# Patient Record
Sex: Female | Born: 1950 | Race: Black or African American | Hispanic: No | Marital: Married | Smoking: Current every day smoker
Health system: Southern US, Community
[De-identification: ages and names within clinical notes are randomized; demographics above are authoritative.]

## PROBLEM LIST (undated history)

## (undated) DIAGNOSIS — I10 Essential (primary) hypertension: Secondary | ICD-10-CM

## (undated) HISTORY — PX: COLONOSCOPY: SHX174

## (undated) HISTORY — PX: FOOT SURGERY: SHX648

## (undated) HISTORY — PX: ABDOMINAL HYSTERECTOMY: SHX81

## (undated) HISTORY — PX: TUBAL LIGATION: SHX77

---

## 2000-04-12 ENCOUNTER — Ambulatory Visit (HOSPITAL_COMMUNITY): Admission: RE | Admit: 2000-04-12 | Discharge: 2000-04-12 | Payer: Self-pay | Admitting: Family Medicine

## 2000-04-12 ENCOUNTER — Encounter: Payer: Self-pay | Admitting: Family Medicine

## 2001-04-22 ENCOUNTER — Encounter: Payer: Self-pay | Admitting: Family Medicine

## 2001-04-22 ENCOUNTER — Ambulatory Visit (HOSPITAL_COMMUNITY): Admission: RE | Admit: 2001-04-22 | Discharge: 2001-04-22 | Payer: Self-pay | Admitting: Family Medicine

## 2008-07-20 ENCOUNTER — Encounter: Payer: Self-pay | Admitting: Gastroenterology

## 2008-07-27 ENCOUNTER — Ambulatory Visit (HOSPITAL_COMMUNITY): Admission: RE | Admit: 2008-07-27 | Discharge: 2008-07-27 | Payer: Self-pay | Admitting: Gastroenterology

## 2008-07-27 ENCOUNTER — Ambulatory Visit: Payer: Self-pay | Admitting: Gastroenterology

## 2010-05-20 NOTE — Op Note (Signed)
NAME:  Kendra Sanford, Kendra Sanford            ACCOUNT NO.:  [id number]   MEDICAL RECORD NO.:  [id number]          PATIENT TYPE:  AMB   LOCATION:  DAY                           FACILITY:  [facility]   PHYSICIAN:  Kassie Mends, M.D.      DATE OF BIRTH:  11/01/1950   DATE OF PROCEDURE:  07/27/2008  DATE OF DISCHARGE:                               OPERATIVE REPORT   REFERRING Marilynne Dupuis:  Cleophus Molt, PA, [facility].   PROCEDURE:  Colonoscopy   INDICATION FOR EXAM:  Kendra Sanford is a 60 year old female who presents  for average-risk colon cancer screening.   FINDINGS:  Rare sigmoid colon diverticula.  Small internal hemorrhoids.  Otherwise, no polyps, masses, inflammatory changes, or AVMs seen.   RECOMMENDATIONS:  1. She should follow a high-fiber diet.  She is given a handout on      high-fiber diet, diverticulosis, and hemorrhoids.  2. Screening colonoscopy in 10 years.   MEDICATIONS:  1. Demerol 75 mg IV.  2. Versed 4 mg IV.   PROCEDURE TECHNIQUE:  Physical exam was performed.  Informed consent was  obtained from the patient after explaining the benefits, risks, and  alternatives to the procedure.  The patient was connected to the monitor  and placed in left lateral position.  Continuous oxygen was provided by  nasal cannula and IV medicine administered through an indwelling  cannula.  After administration of sedation and rectal exam, the  patient's rectum was intubated and scope was advanced under direct  visualization to the cecum.  The scope was removed slowly by carefully  examining the color, texture, anatomy, and integrity of mucosa on the  way out.  The patient was recovered in endoscopy and discharged home in  satisfactory condition.      Kassie Mends, M.D.  Electronically Signed    SM/MEDQ  D:  07/27/2008  T:  07/27/2008  Job:  [id number]   cc:   Reynolds Bowl, MD

## 2017-08-30 ENCOUNTER — Other Ambulatory Visit (HOSPITAL_COMMUNITY): Payer: Self-pay | Admitting: Internal Medicine

## 2017-08-30 DIAGNOSIS — Z1231 Encounter for screening mammogram for malignant neoplasm of breast: Secondary | ICD-10-CM

## 2017-09-08 ENCOUNTER — Encounter (HOSPITAL_COMMUNITY): Payer: Self-pay | Admitting: Radiology

## 2017-09-08 ENCOUNTER — Ambulatory Visit (HOSPITAL_COMMUNITY)
Admission: RE | Admit: 2017-09-08 | Discharge: 2017-09-08 | Disposition: A | Discharge status: Home / Self Care | Payer: Medicare Other | Source: Ambulatory Visit | Attending: Internal Medicine | Admitting: Internal Medicine

## 2017-09-08 DIAGNOSIS — Z1231 Encounter for screening mammogram for malignant neoplasm of breast: Secondary | ICD-10-CM | POA: Diagnosis not present

## 2018-03-02 ENCOUNTER — Other Ambulatory Visit (HOSPITAL_COMMUNITY): Payer: Self-pay | Admitting: Internal Medicine

## 2018-03-02 DIAGNOSIS — Z13820 Encounter for screening for osteoporosis: Secondary | ICD-10-CM

## 2018-03-10 ENCOUNTER — Encounter: Payer: Self-pay | Admitting: Gastroenterology

## 2018-03-14 ENCOUNTER — Ambulatory Visit (HOSPITAL_COMMUNITY)
Admission: RE | Admit: 2018-03-14 | Discharge: 2018-03-14 | Disposition: A | Discharge status: Home / Self Care | Payer: Medicare Other | Source: Ambulatory Visit | Attending: Internal Medicine | Admitting: Internal Medicine

## 2018-03-14 DIAGNOSIS — M85852 Other specified disorders of bone density and structure, left thigh: Secondary | ICD-10-CM | POA: Insufficient documentation

## 2018-03-14 DIAGNOSIS — Z13820 Encounter for screening for osteoporosis: Secondary | ICD-10-CM | POA: Insufficient documentation

## 2018-03-24 ENCOUNTER — Ambulatory Visit: Payer: Medicare Other

## 2018-05-26 ENCOUNTER — Encounter: Payer: Self-pay | Admitting: Gastroenterology

## 2018-06-13 ENCOUNTER — Ambulatory Visit: Payer: Medicare Other

## 2018-08-22 ENCOUNTER — Other Ambulatory Visit: Payer: Self-pay

## 2018-08-22 ENCOUNTER — Ambulatory Visit (INDEPENDENT_AMBULATORY_CARE_PROVIDER_SITE_OTHER): Payer: Self-pay | Admitting: *Deleted

## 2018-08-22 DIAGNOSIS — Z1211 Encounter for screening for malignant neoplasm of colon: Secondary | ICD-10-CM

## 2018-08-22 MED ORDER — PEG 3350-KCL-NA BICARB-NACL 420 G PO SOLR: 4000.0000 mL | Freq: Once | ORAL | 0 refills | Status: AC

## 2018-08-22 NOTE — Progress Notes (Signed)
Gastroenterology Pre-Procedure Review  Request Date: 08/22/2018 Requesting Physician: Dr. Abran Richard @ [facility], Last TCS 07/27/08 done by Dr. Oneida Alar, no polyps  PATIENT REVIEW QUESTIONS: The patient responded to the following health history questions as indicated:    1. Diabetes Melitis: No 2. Joint replacements in the past 12 months: No 3. Major health problems in the past 3 months: No 4. Has an artificial valve or MVP: No 5. Has a defibrillator: No 6. Has been advised in past to take antibiotics in advance of a procedure like teeth cleaning: No 7. Family history of colon cancer: No  8. Alcohol Use: Yes, 1 glass of wine every 2 weeks 9. History of sleep apnea: No  10. History of coronary artery or other vascular stents placed within the last 12 months: No 11. History of any prior anesthesia complications: No    MEDICATIONS & ALLERGIES:    Patient reports the following regarding taking any blood thinners:   Plavix? No Aspirin? Yes Coumadin? No Brilinta? No Xarelto?  Eliquis?  Pradaxa?  Savaysa?  Effient?   Patient confirms/reports the following medications:  Current Outpatient Medications  Medication Sig Dispense Refill  . aspirin EC 81 MG tablet Take 81 mg by mouth daily.    Marland Kitchen ezetimibe (ZETIA) 10 MG tablet Take 10 mg by mouth daily.    Marland Kitchen loratadine (CLARITIN) 10 MG tablet Take 10 mg by mouth daily.    . montelukast (SINGULAIR) 10 MG tablet Take 10 mg by mouth at bedtime.    Marland Kitchen olmesartan (BENICAR) 40 MG tablet Take 40 mg by mouth daily.    . Omega-3 1000 MG CAPS Take by mouth 2 (two) times daily.    Marland Kitchen omeprazole (PRILOSEC) 20 MG capsule Take 20 mg by mouth daily.    . rosuvastatin (CRESTOR) 5 MG tablet Take 5 mg by mouth daily.    . vitamin B-12 (CYANOCOBALAMIN) 1000 MCG tablet Take 1,000 mcg by mouth daily.     No current facility-administered medications for this visit.     Patient confirms/reports the following allergies:  No Known  Allergies  No orders of the defined types were placed in this encounter.   AUTHORIZATION INFORMATION Primary Insurance: BCBS Medicare,  ID #: [id number],  Group #: [id number] Pre-Cert / Josem Kaufmann required: No, not required  SCHEDULE INFORMATION: Procedure has been scheduled as follows:  Date: 09/26/2018, Time: 1:00  Location: [facility] with Dr. Oneida Alar  This Gastroenterology Pre-Precedure Review Form is being routed to the following provider(s): Roseanne Kaufman, NP

## 2018-08-22 NOTE — Progress Notes (Signed)
Appropriate.

## 2018-08-22 NOTE — Patient Instructions (Signed)
Kendra Sanford   1950-03-10 MRN: [id number]    Procedure Date: 09/26/2018 Time to register: 12:00 pm Place to register: [facility] Procedure Time: 1:00 pm Scheduled provider: Dr. Oneida Alar  PREPARATION FOR COLONOSCOPY WITH TRI-LYTE SPLIT PREP  Please notify us immediately if you are diabetic, take iron supplements, or if you are on Coumadin or any other blood thinners.   You will need to purchase 1 fleet enema and 1 box of Bisacodyl 37m tablets.   1 DAY BEFORE PROCEDURE:  DATE: 09/25/2018   DAY: Sunday Continue clear liquids the entire day - NO SOLID FOOD.    At 2:00 pm:  Take 2 Bisacodyl tablets.   At 4:00pm:  Start drinking your solution. Make sure you mix well per instructions on the bottle. Try to drink 1 (one) 8 ounce glass every 10-15 minutes until you have consumed HALF the jug. You should complete by 6:00pm.You must keep the left over solution refrigerated until completed next day.  Continue clear liquids. You must drink plenty of clear liquids to prevent dehyration and kidney failure.     DAY OF PROCEDURE:   DATE: 09/26/2018  DAY: Monday If you take medications for your heart, blood pressure or breathing, you may take these medications.    Five hours before your procedure time @ 8:00 am:  Finish remaining amout of bowel prep, drinking 1 (one) 8 ounce glass every 10-15 minutes until complete. You have two hours to consume remaining prep.   Three hours before your procedure time _0 :00 am:  Nothing by mouth.   At least one hour before going to the hospital:  Give yourself one Fleet enema. You may take your morning medications with sip of water unless we have instructed otherwise.      Please see below for Dietary Information.  CLEAR LIQUIDS INCLUDE:  Water Jello (NOT red in color)   Ice Popsicles (NOT red in color)   Tea (sugar ok, no milk/cream) Powdered fruit flavored drinks  Coffee (sugar ok, no milk/cream) Gatorade/ Lemonade/ Kool-Aid  (NOT red in  color)   Juice: apple, white grape, white cranberry Soft drinks  Clear bullion, consomme, broth (fat free beef/chicken/vegetable)  Carbonated beverages (any kind)  Strained chicken noodle soup Hard Candy   Remember: Clear liquids are liquids that will allow you to see your fingers on the other side of a clear glass. Be sure liquids are NOT red in color, and not cloudy, but CLEAR.  DO NOT EAT OR DRINK ANY OF THE FOLLOWING:  Dairy products of any kind   Cranberry juice Tomato juice / V8 juice   Grapefruit juice Orange juice     Red grape juice  Do not eat any solid foods, including such foods as: cereal, oatmeal, yogurt, fruits, vegetables, creamed soups, eggs, bread, crackers, pureed foods in a blender, etc.   HELPFUL HINTS FOR DRINKING PREP SOLUTION:   Make sure prep is extremely cold. Mix and refrigerate the the morning of the prep. You may also put in the freezer.   You may try mixing some Crystal Light or Country Time Lemonade if you prefer. Mix in small amounts; add more if necessary.  Try drinking through a straw  Rinse mouth with water or a mouthwash between glasses, to remove after-taste.  Try sipping on a cold beverage /ice/ popsicles between glasses of prep.  Place a piece of sugar-free hard candy in mouth between glasses.  If you become nauseated, try consuming smaller amounts, or stretch out the  time between glasses. Stop for 30-60 minutes, then slowly start back drinking.        OTHER INSTRUCTIONS  You will need a responsible adult at least 67 years of age to accompany you and drive you home. This person must remain in the waiting room during your procedure. The hospital will cancel your procedure if you do not have a responsible adult with you.   1. Wear loose fitting clothing that is easily removed. 2. Leave jewelry and other valuables at home.  3. Remove all body piercing jewelry and leave at home. 4. Total time from sign-in until discharge is approximately  2-3 hours. 5. You should go home directly after your procedure and rest. You can resume normal activities the day after your procedure. 6. The day of your procedure you should not:  Drive  Make legal decisions  Operate machinery  Drink alcohol  Return to work   You may call the office (Dept: [phone]) before 5:00pm, or page the doctor on call [phone]) after 5:00pm, for further instructions, if necessary.   Insurance Information YOU WILL NEED TO CHECK WITH YOUR INSURANCE COMPANY FOR THE BENEFITS OF COVERAGE YOU HAVE FOR THIS PROCEDURE.  UNFORTUNATELY, NOT ALL INSURANCE COMPANIES HAVE BENEFITS TO COVER ALL OR PART OF THESE TYPES OF PROCEDURES.  IT IS YOUR RESPONSIBILITY TO CHECK YOUR BENEFITS, HOWEVER, WE WILL BE GLAD TO ASSIST YOU WITH ANY CODES YOUR INSURANCE COMPANY MAY NEED.    PLEASE NOTE THAT MOST INSURANCE COMPANIES WILL NOT COVER A SCREENING COLONOSCOPY FOR PEOPLE UNDER THE AGE OF 50  IF YOU HAVE BCBS INSURANCE, YOU MAY HAVE BENEFITS FOR A SCREENING COLONOSCOPY BUT IF POLYPS ARE FOUND THE DIAGNOSIS WILL CHANGE AND THEN YOU MAY HAVE A DEDUCTIBLE THAT WILL NEED TO BE MET. SO PLEASE MAKE SURE YOU CHECK YOUR BENEFITS FOR A SCREENING COLONOSCOPY AS WELL AS A DIAGNOSTIC COLONOSCOPY.

## 2018-08-23 NOTE — Addendum Note (Signed)
Addended by: Metro Kung on: 08/23/2018 07:46 AM   Modules accepted: Orders, SmartSet

## 2018-09-01 ENCOUNTER — Other Ambulatory Visit (HOSPITAL_COMMUNITY): Payer: Self-pay | Admitting: Internal Medicine

## 2018-09-01 DIAGNOSIS — Z1231 Encounter for screening mammogram for malignant neoplasm of breast: Secondary | ICD-10-CM

## 2018-09-14 ENCOUNTER — Ambulatory Visit (HOSPITAL_COMMUNITY)
Admission: RE | Admit: 2018-09-14 | Discharge: 2018-09-14 | Disposition: A | Discharge status: Home / Self Care | Payer: Medicare Other | Source: Ambulatory Visit | Attending: Internal Medicine | Admitting: Internal Medicine

## 2018-09-14 ENCOUNTER — Other Ambulatory Visit: Payer: Self-pay

## 2018-09-14 DIAGNOSIS — Z1231 Encounter for screening mammogram for malignant neoplasm of breast: Secondary | ICD-10-CM | POA: Diagnosis not present

## 2018-09-23 ENCOUNTER — Other Ambulatory Visit: Payer: Self-pay

## 2018-09-23 ENCOUNTER — Other Ambulatory Visit (HOSPITAL_COMMUNITY)
Admission: RE | Admit: 2018-09-23 | Discharge: 2018-09-23 | Disposition: A | Discharge status: Home / Self Care | Payer: Medicare Other | Source: Ambulatory Visit | Attending: Gastroenterology | Admitting: Gastroenterology

## 2018-09-23 DIAGNOSIS — Z01812 Encounter for preprocedural laboratory examination: Secondary | ICD-10-CM | POA: Insufficient documentation

## 2018-09-23 DIAGNOSIS — Z20828 Contact with and (suspected) exposure to other viral communicable diseases: Secondary | ICD-10-CM | POA: Insufficient documentation

## 2018-09-23 LAB — SARS CORONAVIRUS 2 (TAT 6-24 HRS): SARS Coronavirus 2: NEGATIVE

## 2018-09-26 ENCOUNTER — Encounter (HOSPITAL_COMMUNITY): Payer: Self-pay

## 2018-09-26 ENCOUNTER — Ambulatory Visit (HOSPITAL_COMMUNITY)
Admission: RE | Admit: 2018-09-26 | Discharge: 2018-09-26 | Disposition: A | Discharge status: Home / Self Care | Payer: Medicare Other | Attending: Gastroenterology | Admitting: Gastroenterology

## 2018-09-26 ENCOUNTER — Encounter (HOSPITAL_COMMUNITY)
Admission: RE | Disposition: A | Discharge status: Home / Self Care | Payer: Self-pay | Source: Home / Self Care | Attending: Gastroenterology

## 2018-09-26 ENCOUNTER — Other Ambulatory Visit: Payer: Self-pay

## 2018-09-26 DIAGNOSIS — Z79899 Other long term (current) drug therapy: Secondary | ICD-10-CM | POA: Insufficient documentation

## 2018-09-26 DIAGNOSIS — K648 Other hemorrhoids: Secondary | ICD-10-CM | POA: Insufficient documentation

## 2018-09-26 DIAGNOSIS — I10 Essential (primary) hypertension: Secondary | ICD-10-CM | POA: Insufficient documentation

## 2018-09-26 DIAGNOSIS — Z1211 Encounter for screening for malignant neoplasm of colon: Secondary | ICD-10-CM | POA: Diagnosis not present

## 2018-09-26 DIAGNOSIS — K644 Residual hemorrhoidal skin tags: Secondary | ICD-10-CM | POA: Insufficient documentation

## 2018-09-26 DIAGNOSIS — K6389 Other specified diseases of intestine: Secondary | ICD-10-CM | POA: Insufficient documentation

## 2018-09-26 DIAGNOSIS — Z7982 Long term (current) use of aspirin: Secondary | ICD-10-CM | POA: Insufficient documentation

## 2018-09-26 DIAGNOSIS — F17210 Nicotine dependence, cigarettes, uncomplicated: Secondary | ICD-10-CM | POA: Diagnosis not present

## 2018-09-26 DIAGNOSIS — Z791 Long term (current) use of non-steroidal anti-inflammatories (NSAID): Secondary | ICD-10-CM | POA: Insufficient documentation

## 2018-09-26 HISTORY — PX: COLONOSCOPY: SHX5424

## 2018-09-26 HISTORY — DX: Essential (primary) hypertension: I10

## 2018-09-26 SURGERY — COLONOSCOPY
Anesthesia: Moderate Sedation

## 2018-09-26 MED ORDER — MEPERIDINE HCL 100 MG/ML IJ SOLN
INTRAMUSCULAR | Status: DC | PRN
  Administered 2018-09-26: 50 mg

## 2018-09-26 MED ORDER — MIDAZOLAM HCL 5 MG/5ML IJ SOLN
INTRAMUSCULAR | Status: AC
  Filled 2018-09-26: qty 5

## 2018-09-26 MED ORDER — MIDAZOLAM HCL 5 MG/5ML IJ SOLN
INTRAMUSCULAR | Status: DC | PRN
  Administered 2018-09-26 (×2): 2 mg via INTRAVENOUS

## 2018-09-26 MED ORDER — SODIUM CHLORIDE 0.9 % IV SOLN
INTRAVENOUS | Status: DC
  Administered 2018-09-26: 13:00:00 via INTRAVENOUS

## 2018-09-26 MED ORDER — MEPERIDINE HCL 100 MG/ML IJ SOLN
INTRAMUSCULAR | Status: AC
  Filled 2018-09-26: qty 1

## 2018-09-26 NOTE — Op Note (Signed)
[facility] Patient Name: Kendra Sanford Procedure Date: 09/26/2018 1:08 PM MRN: [id number] Date of Birth: 09-Dec-1950 Attending MD: Jonette Eva MD, MD CSN: [id number] Age: 68 Admit Type: Outpatient Procedure:                Colonoscopy, SCREENING Indications:              Screening for colorectal malignant neoplasm Providers:                Jonette Eva MD, MD, Jannett Celestine, RN, Dyann Ruddle Referring MD:             Alvina Filbert Medicines:                Meperidine 50 mg IV, Midazolam 4 mg IV Complications:            No immediate complications. Estimated Blood Loss:     Estimated blood loss: none. Procedure:                Pre-Anesthesia Assessment:                           - Prior to the procedure, a History and Physical                            was performed, and patient medications and                            allergies were reviewed. The patient's tolerance of                            previous anesthesia was also reviewed. The risks                            and benefits of the procedure and the sedation                            options and risks were discussed with the patient.                            All questions were answered, and informed consent                            was obtained. Prior Anticoagulants: The patient has                            taken no previous anticoagulant or antiplatelet                            agents except for aspirin. ASA Grade Assessment: II                            - A patient with mild systemic disease. After                            reviewing the risks and benefits, the patient was  deemed in satisfactory condition to undergo the                            procedure. After obtaining informed consent, the                            colonoscope was passed under direct vision.                            Throughout the procedure, the patient's blood                            pressure,  pulse, and oxygen saturations were                            monitored continuously. The [id number]-[id number] ([id number])                            scope was introduced through the anus and advanced                            to the the cecum, identified by appendiceal orifice                            and ileocecal valve. The colonoscopy was somewhat                            difficult due to a tortuous colon. Successful                            completion of the procedure was aided by                            straightening and shortening the scope to obtain                            bowel loop reduction and COLOWRAP. The patient                            tolerated the procedure well. The quality of the                            bowel preparation was excellent. The ileocecal                            valve, appendiceal orifice, and rectum were                            photographed. Scope In: 1:39:06 PM Scope Out: 1:53:34 PM Scope Withdrawal Time: 0 hours 11 minutes 29 seconds  Total Procedure Duration: 0 hours 14 minutes 28 seconds  Findings:      External and internal hemorrhoids were found.      The recto-sigmoid colon, sigmoid colon and descending colon were  moderately tortuous. Impression:               - External and internal hemorrhoids.                           - Tortuous colon. Moderate Sedation:      Moderate (conscious) sedation was administered by the endoscopy nurse       and supervised by the endoscopist. The following parameters were       monitored: oxygen saturation, heart rate, blood pressure, and response       to care. Total physician intraservice time was 25 minutes. Recommendation:           - Patient has a contact number available for                            emergencies. The signs and symptoms of potential                            delayed complications were discussed with the                            patient. Return to normal activities tomorrow.                             Written discharge instructions were provided to the                            patient.                           - High fiber diet.                           - Continue present medications.                           - Repeat colonoscopy is not recommended due to                            current age 75(66 years or older) for surveillance. Procedure Code(s):        --- Professional ---                           [id number], Colonoscopy, flexible; diagnostic, including                            collection of specimen(s) by brushing or washing,                            when performed (separate procedure)                           99153, Moderate sedation; each additional 15                            minutes intraservice time  G0500, Moderate sedation services provided by the                            same physician or other qualified health care                            professional performing a gastrointestinal                            endoscopic service that sedation supports,                            requiring the presence of an independent trained                            observer to assist in the monitoring of the                            patient's level of consciousness and physiological                            status; initial 15 minutes of intra-service time;                            patient age 32 years or older (additional time may                            be reported with 24235, as appropriate) Diagnosis Code(s):        --- Professional ---                           Z12.11, Encounter for screening for malignant                            neoplasm of colon                           K64.8, Other hemorrhoids                           Q43.8, Other specified congenital malformations of                            intestine CPT copyright 2019 American Medical Association. All rights reserved. The codes documented in this  report are preliminary and upon coder review may  be revised to meet current compliance requirements. Jonette Eva, MD Jonette Eva MD, MD 09/26/2018 2:17:32 PM This report has been signed electronically. Number of Addenda: 0

## 2018-09-26 NOTE — Discharge Instructions (Signed)
You have internal and external hemorrhoids. YOU DID NOT HAVE ANY POLYPS.  EAT TO LIVE AND THINK OF FOOD AS MEDICINE. 75% OF YOUR PLATE SHOULD BE FRUITS/VEGGIES.  DRINK WATER TO KEEP YOUR URINE LIGHT YELLOW.  To have more energy and to lose weight:      1. CONTINUE YOUR WEIGHT LOSS EFFORTS. I RECOMMEND YOU READ AND FOLLOW RECOMMENDATIONS BY DR. MARK HYMAN, "10-DAY DETOX DIET".    2. If you must eat bread, EAT EZEKIEL BREAD. IT IS IN THE FROZEN SECTION OF THE GROCERY STORE.    3. Do not drink SODA, GATORADE, ENERGY DRINKS, OR DIET SODA.     4. AVOID HIGH FRUCTOSE CORN SYRUP.     5. DO NOT chew SUGAR FREE GUM OR USE ARTIFICIAL SWEETENERS. USE STEVIA AS A SWEETENER.    6. DO NOT EAT ENRICHED WHEAT FLOUR, PASTA, RICE, OR CEREAL.    7. ONLY EAT WILD CAUGHT SEAFOOD, GRASS FED BEEF OR CHICKEN, PORK FROM PASTURE RAISE PIGS, OR EGGS FROM PASTURE RAISED CHICKENS.    8. PRACTICE CHAIR YOGA FOR 15-30 MINS 3 OR 4 TIMES A WEEK AND PROGRESS TO HATHA YOGA OVER NEXT 6 MOS.    9. START TAKING MULTIVITAMIN, VITAMIN B12, AND VITAMIN D3 2000 IU DAILY.   USE PREPARATION H FOUR TIMES  A DAY IF NEEDED TO RELIEVE RECTAL PAIN/PRESSURE/BLEEDING.   We do not routinely screen for polyps after the age of 40.   Colonoscopy Care After Read the instructions outlined below and refer to this sheet in the next week. These discharge instructions provide you with general information on caring for yourself after you leave the hospital. While your treatment has been planned according to the most current medical practices available, unavoidable complications occasionally occur. If you have any problems or questions after discharge, call DR. Jacey Eckerson, [phone].  ACTIVITY  You may resume your regular activity, but move at a slower pace for the next 24 hours.   Take frequent rest periods for the next 24 hours.   Walking will help get rid of the air and reduce the bloated feeling in your belly (abdomen).   No  driving for 24 hours (because of the medicine (anesthesia) used during the test).   You may shower.   Do not sign any important legal documents or operate any machinery for 24 hours (because of the anesthesia used during the test).    NUTRITION  Drink plenty of fluids.   You may resume your normal diet as instructed by your doctor.   Begin with a light meal and progress to your normal diet. Heavy or fried foods are harder to digest and may make you feel sick to your stomach (nauseated).   Avoid alcoholic beverages for 24 hours or as instructed.    MEDICATIONS  You may resume your normal medications.   WHAT YOU CAN EXPECT TODAY  Some feelings of bloating in the abdomen.   Passage of more gas than usual.   Spotting of blood in your stool or on the toilet paper  .  IF YOU HAD POLYPS REMOVED DURING THE COLONOSCOPY:  Eat a soft diet IF YOU HAVE NAUSEA, BLOATING, ABDOMINAL PAIN, OR VOMITING.    FINDING OUT THE RESULTS OF YOUR TEST Not all test results are available during your visit. DR. Darrick Penna WILL CALL YOU WITHIN 14 DAYS OF YOUR PROCEDUE WITH YOUR RESULTS. Do not assume everything is normal if you have not heard from DR. Ezzard Ditmer, CALL HER OFFICE AT [phone].  SEEK  IMMEDIATE MEDICAL ATTENTION AND CALL THE OFFICE: [phone] IF:  You have more than a spotting of blood in your stool.   Your belly is swollen (abdominal distention).   You are nauseated or vomiting.   You have a temperature over 101F.   You have abdominal pain or discomfort that is severe or gets worse throughout the day.   High-Fiber Diet A high-fiber diet changes your normal diet to include more whole grains, legumes, fruits, and vegetables. Changes in the diet involve replacing refined carbohydrates with unrefined foods. The calorie level of the diet is essentially unchanged. The Dietary Reference Intake (recommended amount) for adult males is 38 grams per day. For adult females, it is 25 grams per  day. Pregnant and lactating women should consume 28 grams of fiber per day. Fiber is the intact part of a plant that is not broken down during digestion. Functional fiber is fiber that has been isolated from the plant to provide a beneficial effect in the body.  PURPOSE  Increase stool bulk.   Ease and regulate bowel movements.   Lower cholesterol.   REDUCE RISK OF COLON CANCER  INDICATIONS THAT YOU NEED MORE FIBER  Constipation and hemorrhoids.   Uncomplicated diverticulosis (intestine condition) and irritable bowel syndrome.   Weight management.   As a protective measure against hardening of the arteries (atherosclerosis), diabetes, and cancer.   GUIDELINES FOR INCREASING FIBER IN THE DIET  Start adding fiber to the diet slowly. A gradual increase of about 5 more grams (2 servings of most fruits or vegetables) per day is best. Too rapid an increase in fiber may result in constipation, flatulence, and bloating.   Drink enough water and fluids to keep your urine clear or pale yellow. Water, juice, or caffeine-free drinks are recommended. Not drinking enough fluid may cause constipation.   Eat a variety of high-fiber foods rather than one type of fiber.   Try to increase your intake of fiber through using high-fiber foods rather than fiber pills or supplements that contain small amounts of fiber.   The goal is to change the types of food eaten. Do not supplement your present diet with high-fiber foods, but replace foods in your present diet.

## 2018-09-26 NOTE — H&P (Signed)
Primary Care Physician:  Abran Richard, MD Primary Gastroenterologist:  Dr. Oneida Alar  Pre-Procedure History & Physical: HPI:  Kendra Sanford is a 68 y.o. female here for [facility].  Past Medical History:  Diagnosis Date  . Hypertension     History reviewed. No pertinent surgical history.  Prior to Admission medications   Medication Sig Start Date End Date Taking? Authorizing Provider  aspirin EC 81 MG tablet Take 81 mg by mouth daily.   Yes [provider]  cetirizine (ZYRTEC) 10 MG tablet Take 10 mg by mouth daily.   Yes [provider]  ezetimibe (ZETIA) 10 MG tablet Take 10 mg by mouth daily.   Yes [provider]  ibuprofen (ADVIL) 200 MG tablet Take 400 mg by mouth every 6 (six) hours as needed for headache.   Yes [provider]  montelukast (SINGULAIR) 10 MG tablet Take 10 mg by mouth daily.    Yes [provider]  olmesartan (BENICAR) 40 MG tablet Take 40 mg by mouth daily.   Yes [provider]  Omega-3 1000 MG CAPS Take 1,000 mg by mouth 2 (two) times daily.    Yes [provider]  omeprazole (PRILOSEC) 20 MG capsule Take 20 mg by mouth daily.   Yes [provider]  rosuvastatin (CRESTOR) 5 MG tablet Take 5 mg by mouth daily.   Yes [provider]  vitamin B-12 (CYANOCOBALAMIN) 1000 MCG tablet Take 1,000 mcg by mouth daily.   Yes [provider]    Allergies as of 08/23/2018  . (No Known Allergies)    History reviewed. No pertinent family history.  Social History   Socioeconomic History  . Marital status: Married    Spouse name: Not on file  . Number of children: Not on file  . Years of education: Not on file  . Highest education level: Not on file  Occupational History  . Not on file  Social Needs  . Financial resource strain: Not on file  . Food insecurity    Worry: Not on file    Inability: Not on file  . Transportation needs    Medical: Not on file     Non-medical: Not on file  Tobacco Use  . Smoking status: Current Every Day Smoker    Packs/day: 0.25    Types: Cigarettes  . Smokeless tobacco: Never Used  Substance and Sexual Activity  . Alcohol use: Yes    Comment: 1 glass of wine every 2 weeks  . Drug use: Never  . Sexual activity: Not on file  Lifestyle  . Physical activity    Days per week: Not on file    Minutes per session: Not on file  . Stress: Not on file  Relationships  . Social Herbalist on phone: Not on file    Gets together: Not on file    Attends religious service: Not on file    Active member of club or organization: Not on file    Attends meetings of clubs or organizations: Not on file    Relationship status: Not on file  . Intimate partner violence    Fear of current or ex partner: Not on file    Emotionally abused: Not on file    Physically abused: Not on file    Forced sexual activity: Not on file  Other Topics Concern  . Not on file  Social History Narrative  . Not on file    Review of  Systems: See HPI, otherwise negative ROS   Physical Exam: BP (!) 193/90   Temp 98.7 F (37.1 C) (Oral)   Resp 12   Ht 5\' 5"  (1.651 m)   Wt 91.2 kg   SpO2 100%   BMI 33.45 kg/m  General:   Alert,  pleasant and cooperative in NAD Head:  Normocephalic and atraumatic. Neck:  Supple; Lungs:  Clear throughout to auscultation.    Heart:  Regular rate and rhythm. Abdomen:  Soft, nontender and nondistended. Normal bowel sounds, without guarding, and without rebound.   Neurologic:  Alert and  oriented x4;  grossly normal neurologically.  Impression/Plan:    SCREENING  Plan:  1. TCS TODAY DISCUSSED PROCEDURE, BENEFITS, & RISKS: < 1% chance of medication reaction, bleeding, perforation, ASPIRATION, or rupture of spleen/liver requiring surgery to fix it and missed polyps < 1 cm 10-20% of the time.

## 2018-09-29 ENCOUNTER — Encounter (HOSPITAL_COMMUNITY): Payer: Self-pay | Admitting: Gastroenterology

## 2019-12-12 ENCOUNTER — Other Ambulatory Visit (HOSPITAL_COMMUNITY): Payer: Self-pay | Admitting: Internal Medicine

## 2019-12-12 DIAGNOSIS — Z1231 Encounter for screening mammogram for malignant neoplasm of breast: Secondary | ICD-10-CM

## 2020-11-13 ENCOUNTER — Other Ambulatory Visit: Payer: Self-pay | Admitting: Family Medicine

## 2020-11-13 ENCOUNTER — Other Ambulatory Visit (HOSPITAL_COMMUNITY): Payer: Self-pay | Admitting: Family Medicine

## 2020-11-13 ENCOUNTER — Other Ambulatory Visit: Payer: Self-pay

## 2020-11-13 ENCOUNTER — Ambulatory Visit (HOSPITAL_COMMUNITY)
Admission: RE | Admit: 2020-11-13 | Discharge: 2020-11-13 | Disposition: A | Discharge status: Home / Self Care | Payer: Medicare Other | Source: Ambulatory Visit | Attending: Family Medicine | Admitting: Family Medicine

## 2020-11-13 DIAGNOSIS — R609 Edema, unspecified: Secondary | ICD-10-CM | POA: Diagnosis present

## 2020-11-13 DIAGNOSIS — M79605 Pain in left leg: Secondary | ICD-10-CM

## 2020-11-20 ENCOUNTER — Other Ambulatory Visit: Payer: Self-pay

## 2020-11-20 ENCOUNTER — Ambulatory Visit (HOSPITAL_COMMUNITY)
Admission: RE | Admit: 2020-11-20 | Discharge: 2020-11-20 | Disposition: A | Discharge status: Home / Self Care | Payer: Medicare Other | Source: Ambulatory Visit | Attending: Internal Medicine | Admitting: Internal Medicine

## 2020-11-20 DIAGNOSIS — Z1231 Encounter for screening mammogram for malignant neoplasm of breast: Secondary | ICD-10-CM | POA: Insufficient documentation

## 2021-09-04 ENCOUNTER — Other Ambulatory Visit (HOSPITAL_COMMUNITY): Payer: Self-pay | Admitting: Internal Medicine

## 2021-09-04 DIAGNOSIS — Z13820 Encounter for screening for osteoporosis: Secondary | ICD-10-CM

## 2021-09-22 ENCOUNTER — Ambulatory Visit (HOSPITAL_COMMUNITY)
Admission: RE | Admit: 2021-09-22 | Discharge: 2021-09-22 | Disposition: A | Discharge status: Home / Self Care | Payer: Medicare Other | Source: Ambulatory Visit | Attending: Internal Medicine | Admitting: Internal Medicine

## 2021-09-22 DIAGNOSIS — M85852 Other specified disorders of bone density and structure, left thigh: Secondary | ICD-10-CM | POA: Diagnosis not present

## 2021-09-22 DIAGNOSIS — Z780 Asymptomatic menopausal state: Secondary | ICD-10-CM | POA: Insufficient documentation

## 2021-09-22 DIAGNOSIS — F17200 Nicotine dependence, unspecified, uncomplicated: Secondary | ICD-10-CM | POA: Diagnosis not present

## 2021-09-22 DIAGNOSIS — Z13820 Encounter for screening for osteoporosis: Secondary | ICD-10-CM | POA: Diagnosis present

## 2021-09-22 DIAGNOSIS — M069 Rheumatoid arthritis, unspecified: Secondary | ICD-10-CM | POA: Diagnosis not present

## 2022-10-20 ENCOUNTER — Other Ambulatory Visit (HOSPITAL_COMMUNITY): Payer: Self-pay | Admitting: Internal Medicine

## 2022-10-20 DIAGNOSIS — Z1231 Encounter for screening mammogram for malignant neoplasm of breast: Secondary | ICD-10-CM

## 2022-10-26 ENCOUNTER — Encounter (HOSPITAL_COMMUNITY): Payer: Self-pay

## 2022-10-26 ENCOUNTER — Ambulatory Visit (HOSPITAL_COMMUNITY)
Admission: RE | Admit: 2022-10-26 | Discharge: 2022-10-26 | Disposition: A | Discharge status: Home / Self Care | Payer: Medicare Other | Source: Ambulatory Visit | Attending: Internal Medicine | Admitting: Internal Medicine

## 2022-10-26 DIAGNOSIS — Z1231 Encounter for screening mammogram for malignant neoplasm of breast: Secondary | ICD-10-CM | POA: Diagnosis present

## 2023-09-25 IMAGING — MG MM DIGITAL SCREENING BILAT W/ TOMO AND CAD
6 of 10 series · 6 of 30 positions shown · non-contrast
Comparison: Previous exam(s).

CLINICAL DATA: Screening.

EXAM:
DIGITAL SCREENING BILATERAL MAMMOGRAM WITH TOMOSYNTHESIS AND CAD
TECHNIQUE: Bilateral screening digital craniocaudal and mediolateral oblique
mammograms were obtained. Bilateral screening digital breast
tomosynthesis was performed. The images were evaluated with
computer-aided detection.

[L MLO synth-2D]
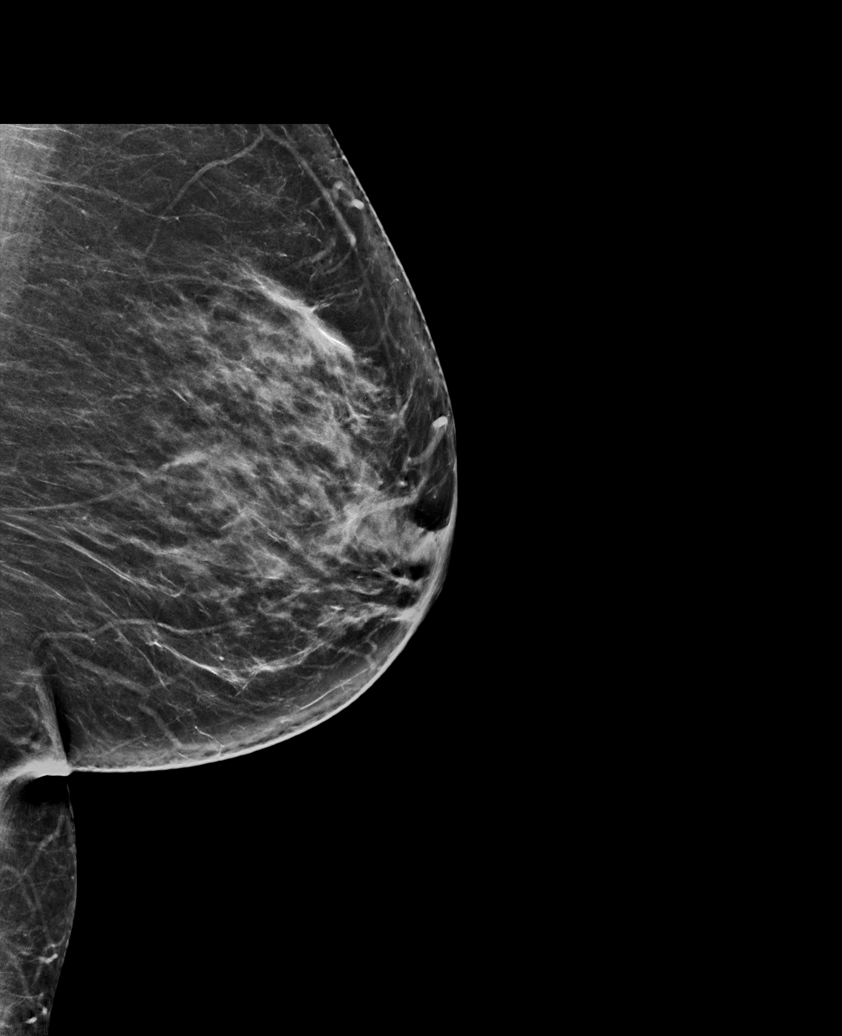

[R MLO synth-2D (1 of 2)]
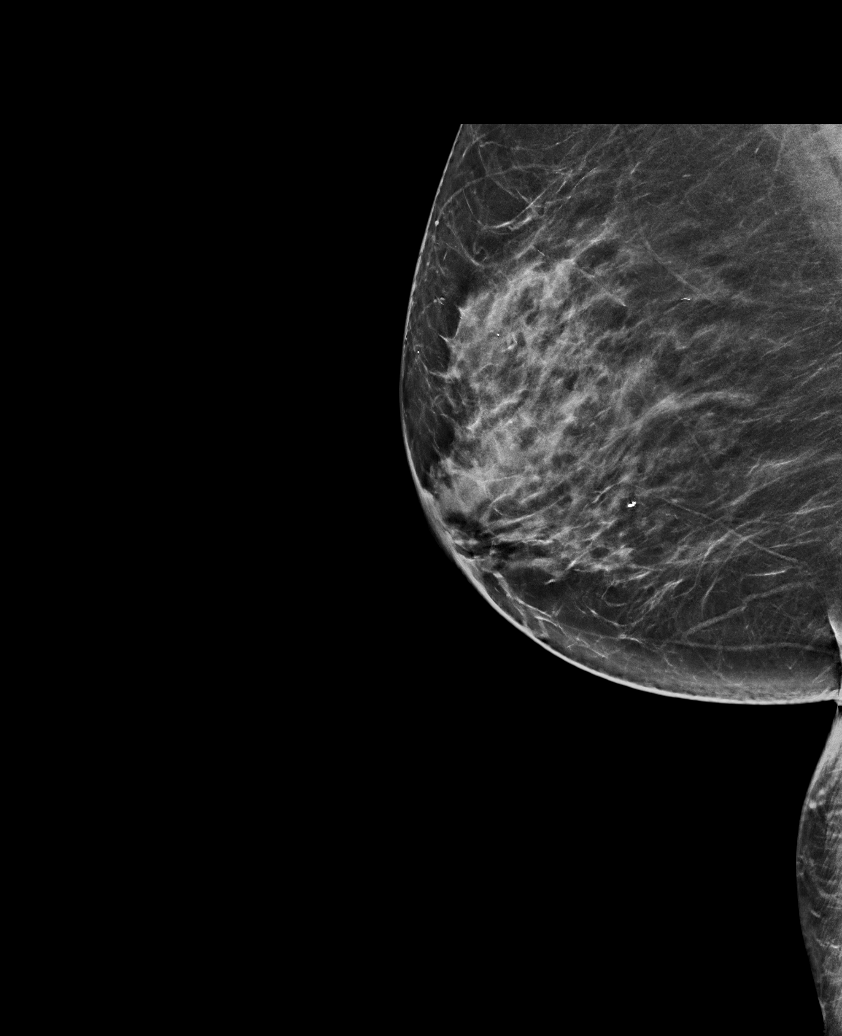

[R CC synth-2D]
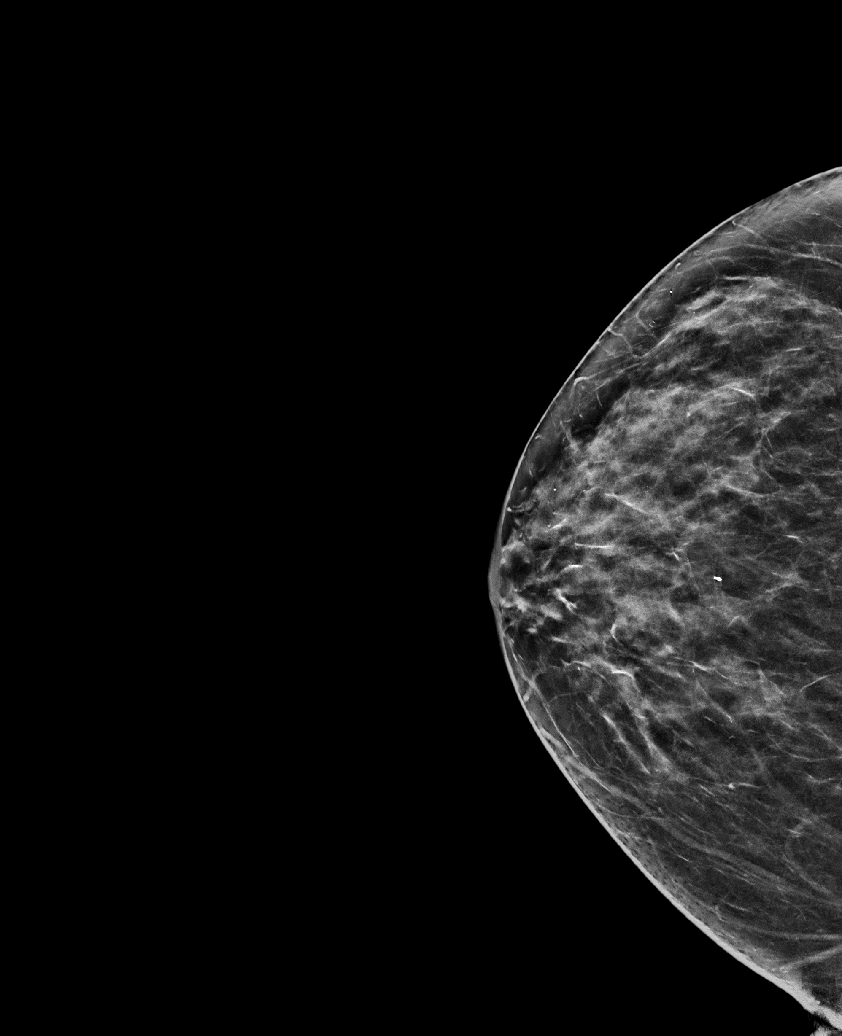

[L CC synth-2D]
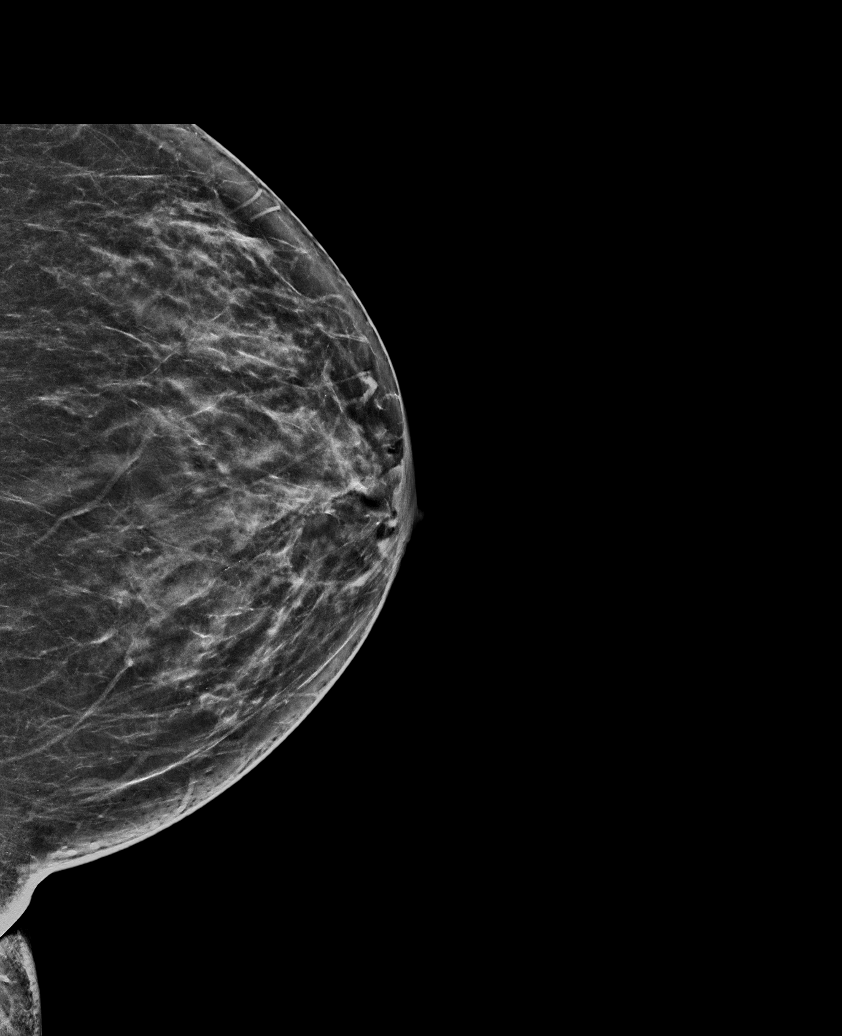

[R MLO synth-2D (2 of 2)]
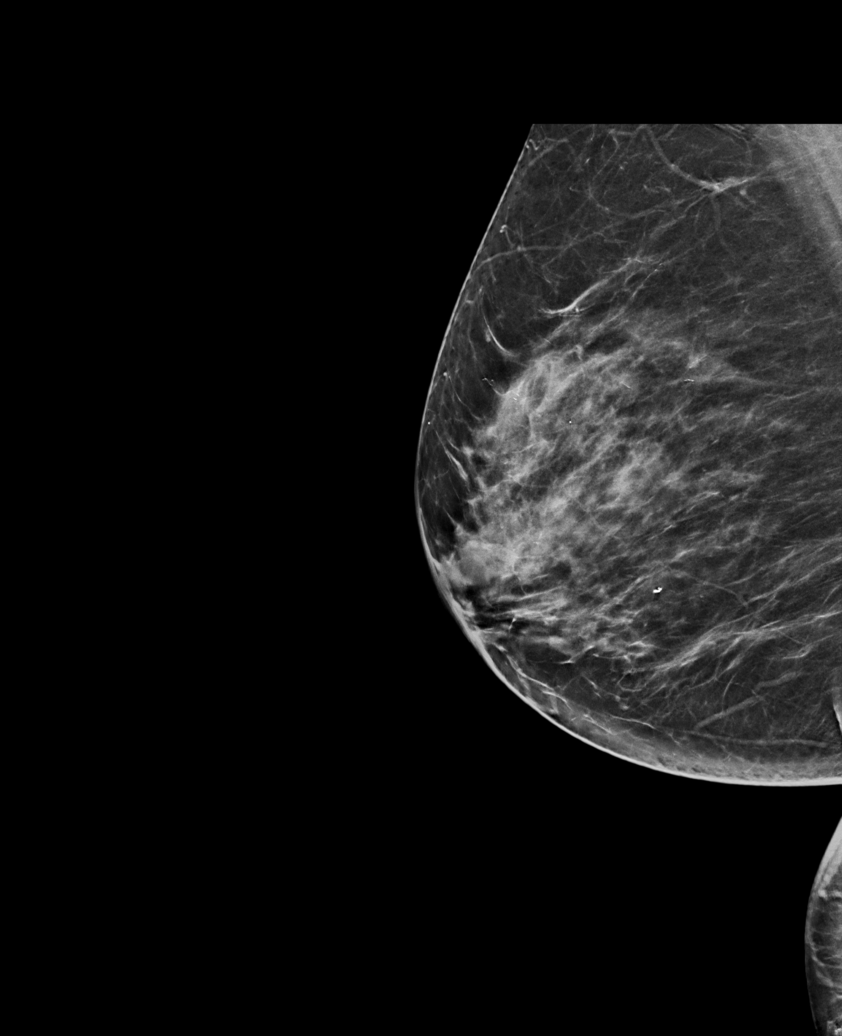

[R CC tomo · tomo slice 33/66.0]
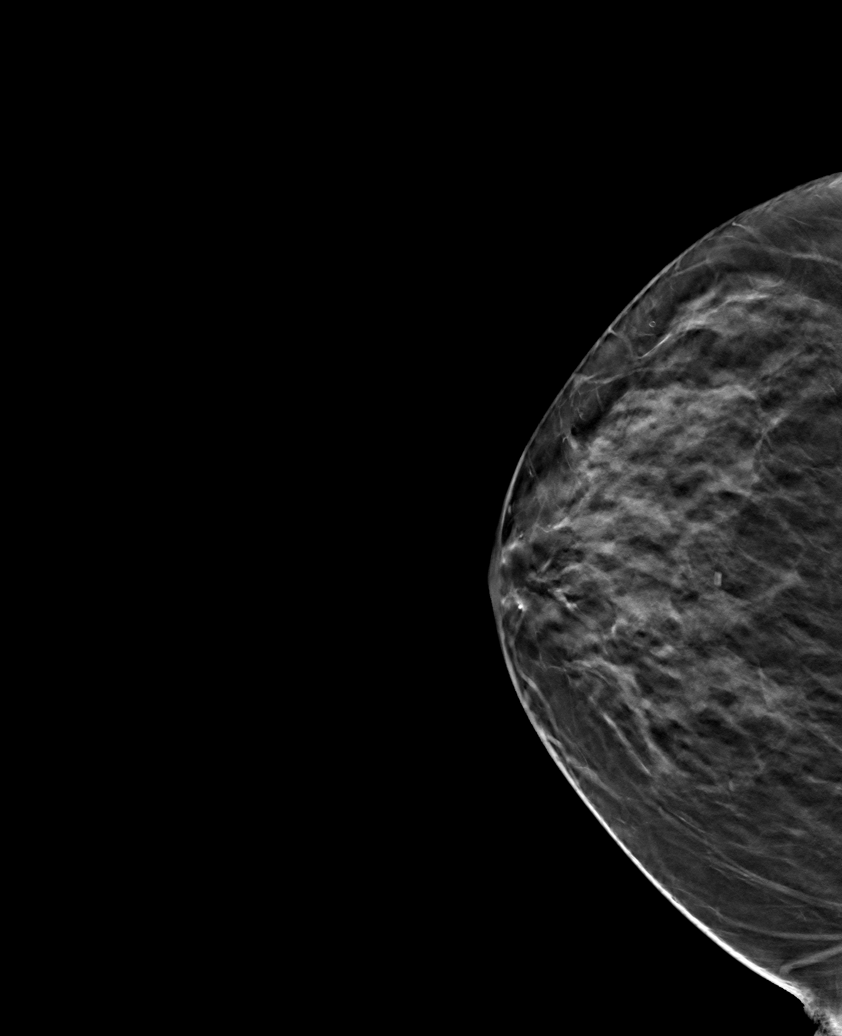

[6 of 30 positions shown; findings below may reference images not displayed]

ACR Breast Density Category c: The breast tissue is heterogeneously
dense, which may obscure small masses.
FINDINGS: There are no findings suspicious for malignancy.
IMPRESSION: No mammographic evidence of malignancy. A result letter of this
screening mammogram will be mailed directly to the patient.

RECOMMENDATION:
Screening mammogram in one year. (Code:Q3-W-BC3)

BI-RADS CATEGORY  1: Negative.

## 2023-11-17 ENCOUNTER — Other Ambulatory Visit (HOSPITAL_COMMUNITY): Payer: Self-pay | Admitting: Internal Medicine

## 2023-11-17 DIAGNOSIS — Z1231 Encounter for screening mammogram for malignant neoplasm of breast: Secondary | ICD-10-CM

## 2023-11-25 ENCOUNTER — Encounter (HOSPITAL_COMMUNITY): Payer: Self-pay

## 2023-11-25 ENCOUNTER — Ambulatory Visit (HOSPITAL_COMMUNITY)
Admission: RE | Admit: 2023-11-25 | Discharge: 2023-11-25 | Disposition: A | Discharge status: Home / Self Care | Source: Ambulatory Visit | Attending: Internal Medicine | Admitting: Internal Medicine

## 2023-11-25 DIAGNOSIS — Z1231 Encounter for screening mammogram for malignant neoplasm of breast: Secondary | ICD-10-CM | POA: Insufficient documentation
